# Patient Record
Sex: Female | Born: 1950 | Race: White | Hispanic: No | Marital: Married
Health system: Southern US, Community
[De-identification: ages and names within clinical notes are randomized; demographics above are authoritative.]

---

## 2004-05-07 ENCOUNTER — Ambulatory Visit: Payer: Self-pay | Admitting: Family Medicine

## 2005-02-25 ENCOUNTER — Ambulatory Visit: Payer: Self-pay | Admitting: General Practice

## 2005-03-07 ENCOUNTER — Ambulatory Visit: Payer: Self-pay | Admitting: Obstetrics and Gynecology

## 2006-05-07 ENCOUNTER — Emergency Department: Payer: Self-pay | Admitting: Unknown Physician Specialty

## 2007-09-06 ENCOUNTER — Ambulatory Visit: Payer: Self-pay

## 2010-07-30 ENCOUNTER — Ambulatory Visit: Payer: Self-pay | Admitting: Specialist

## 2010-08-08 ENCOUNTER — Ambulatory Visit: Payer: Self-pay | Admitting: Specialist

## 2012-08-24 ENCOUNTER — Ambulatory Visit: Payer: Self-pay | Admitting: Family Medicine

## 2012-08-26 ENCOUNTER — Ambulatory Visit: Payer: Self-pay | Admitting: Cardiothoracic Surgery

## 2012-08-26 LAB — APTT: Activated PTT: 36.1 secs — ABNORMAL HIGH (ref 23.6–35.9)

## 2012-08-26 LAB — CBC CANCER CENTER
Basophil #: 0.1 x10 3/mm (ref 0.0–0.1)
Basophil %: 0.7 %
Eosinophil #: 0.1 x10 3/mm (ref 0.0–0.7)
Eosinophil %: 0.7 %
HCT: 37.4 % (ref 35.0–47.0)
Lymphocyte #: 2.8 x10 3/mm (ref 1.0–3.6)
MCH: 27.7 pg (ref 26.0–34.0)
MCV: 85 fL (ref 80–100)
Monocyte %: 6.4 %
Neutrophil #: 10.4 x10 3/mm — ABNORMAL HIGH (ref 1.4–6.5)
Platelet: 632 x10 3/mm — ABNORMAL HIGH (ref 150–440)
RBC: 4.42 10*6/uL (ref 3.80–5.20)
RDW: 13.8 % (ref 11.5–14.5)

## 2012-08-26 LAB — COMPREHENSIVE METABOLIC PANEL
Albumin: 3.3 g/dL — ABNORMAL LOW (ref 3.4–5.0)
Alkaline Phosphatase: 98 U/L (ref 50–136)
BUN: 11 mg/dL (ref 7–18)
Bilirubin,Total: 0.2 mg/dL (ref 0.2–1.0)
Chloride: 103 mmol/L (ref 98–107)
Creatinine: 0.97 mg/dL (ref 0.60–1.30)
EGFR (African American): >60
EGFR (Non-African Amer.): >60
Glucose: 113 mg/dL — ABNORMAL HIGH (ref 65–99)
Potassium: 3.6 mmol/L (ref 3.5–5.1)
SGOT(AST): 12 U/L — ABNORMAL LOW (ref 15–37)
SGPT (ALT): 17 U/L (ref 12–78)
Sodium: 140 mmol/L (ref 136–145)

## 2012-08-26 LAB — PROTIME-INR
INR: 1.2
Prothrombin Time: 15.4 secs — ABNORMAL HIGH (ref 11.5–14.7)

## 2012-09-05 ENCOUNTER — Ambulatory Visit: Payer: Self-pay | Admitting: Cardiothoracic Surgery

## 2012-09-06 ENCOUNTER — Ambulatory Visit: Payer: Self-pay | Admitting: Cardiothoracic Surgery

## 2012-09-07 ENCOUNTER — Ambulatory Visit: Payer: Self-pay | Admitting: Cardiothoracic Surgery

## 2012-09-09 LAB — COMPREHENSIVE METABOLIC PANEL
Albumin: 3 g/dL — ABNORMAL LOW (ref 3.4–5.0)
Alkaline Phosphatase: 93 U/L (ref 50–136)
Anion Gap: 11 (ref 7–16)
Bilirubin,Total: 0.3 mg/dL (ref 0.2–1.0)
Calcium, Total: 9.4 mg/dL (ref 8.5–10.1)
Chloride: 103 mmol/L (ref 98–107)
Co2: 26 mmol/L (ref 21–32)
EGFR (African American): >60
EGFR (Non-African Amer.): >60
Glucose: 99 mg/dL (ref 65–99)
Osmolality: 279 (ref 275–301)
Potassium: 3.7 mmol/L (ref 3.5–5.1)
SGPT (ALT): 17 U/L (ref 12–78)

## 2012-09-09 LAB — CBC CANCER CENTER
Basophil #: 0.1 x10 3/mm (ref 0.0–0.1)
Basophil %: 0.9 %
HCT: 33.6 % — ABNORMAL LOW (ref 35.0–47.0)
HGB: 10.9 g/dL — ABNORMAL LOW (ref 12.0–16.0)
Lymphocyte #: 2.8 x10 3/mm (ref 1.0–3.6)
Lymphocyte %: 18.8 %
MCHC: 32.5 g/dL (ref 32.0–36.0)
MCV: 84 fL (ref 80–100)
Monocyte #: 1 x10 3/mm — ABNORMAL HIGH (ref 0.2–0.9)
Neutrophil #: 10.7 x10 3/mm — ABNORMAL HIGH (ref 1.4–6.5)
Neutrophil %: 72.9 %
RDW: 14.2 % (ref 11.5–14.5)
WBC: 14.7 x10 3/mm — ABNORMAL HIGH (ref 3.6–11.0)

## 2012-09-10 LAB — CEA: CEA: 628.5 ng/mL — ABNORMAL HIGH (ref 0.0–4.7)

## 2012-09-16 ENCOUNTER — Other Ambulatory Visit: Payer: Self-pay | Admitting: Oncology

## 2012-09-16 LAB — PROTIME-INR
INR: 1.2
Prothrombin Time: 15.2 secs — ABNORMAL HIGH (ref 11.5–14.7)

## 2012-09-22 ENCOUNTER — Ambulatory Visit: Payer: Self-pay | Admitting: Cardiothoracic Surgery

## 2012-09-24 LAB — COMPREHENSIVE METABOLIC PANEL
Albumin: 2.6 g/dL — ABNORMAL LOW (ref 3.4–5.0)
BUN: 12 mg/dL (ref 7–18)
Calcium, Total: 9.6 mg/dL (ref 8.5–10.1)
Chloride: 99 mmol/L (ref 98–107)
Creatinine: 0.9 mg/dL (ref 0.60–1.30)
Osmolality: 275 (ref 275–301)
Potassium: 3.4 mmol/L — ABNORMAL LOW (ref 3.5–5.1)
SGPT (ALT): 24 U/L (ref 12–78)
Sodium: 134 mmol/L — ABNORMAL LOW (ref 136–145)
Total Protein: 7.5 g/dL (ref 6.4–8.2)

## 2012-09-24 LAB — CBC CANCER CENTER
Eosinophil #: 0 x10 3/mm (ref 0.0–0.7)
Eosinophil %: 0 %
HGB: 10.2 g/dL — ABNORMAL LOW (ref 12.0–16.0)
Lymphocyte %: 6.3 %
Monocyte #: 0.1 x10 3/mm — ABNORMAL LOW (ref 0.2–0.9)
Neutrophil #: 16.8 x10 3/mm — ABNORMAL HIGH (ref 1.4–6.5)
Neutrophil %: 92.3 %
RBC: 3.8 10*6/uL (ref 3.80–5.20)
WBC: 18.2 x10 3/mm — ABNORMAL HIGH (ref 3.6–11.0)

## 2012-10-01 LAB — CBC CANCER CENTER
Basophil %: 0.1 %
Eosinophil %: 0 %
HCT: 29.5 % — ABNORMAL LOW (ref 35.0–47.0)
HGB: 9.8 g/dL — ABNORMAL LOW (ref 12.0–16.0)
MCH: 27 pg (ref 26.0–34.0)
MCHC: 33.1 g/dL (ref 32.0–36.0)
Monocyte #: 0.2 x10 3/mm (ref 0.2–0.9)
Monocyte %: 1.3 %
Platelet: 606 x10 3/mm — ABNORMAL HIGH (ref 150–440)
RBC: 3.61 10*6/uL — ABNORMAL LOW (ref 3.80–5.20)
RDW: 14.4 % (ref 11.5–14.5)
WBC: 16.8 x10 3/mm — ABNORMAL HIGH (ref 3.6–11.0)

## 2012-10-05 ENCOUNTER — Ambulatory Visit: Payer: Self-pay | Admitting: Cardiothoracic Surgery

## 2012-10-14 LAB — CBC CANCER CENTER
Basophil %: 1.4 %
Eosinophil #: 0 x10 3/mm (ref 0.0–0.7)
HCT: 28.6 % — ABNORMAL LOW (ref 35.0–47.0)
HGB: 9.5 g/dL — ABNORMAL LOW (ref 12.0–16.0)
Lymphocyte #: 2 x10 3/mm (ref 1.0–3.6)
Lymphocyte %: 10.5 %
MCH: 27.2 pg (ref 26.0–34.0)
MCHC: 33.1 g/dL (ref 32.0–36.0)
MCV: 82 fL (ref 80–100)
Monocyte %: 2.3 %
Neutrophil #: 16.6 x10 3/mm — ABNORMAL HIGH (ref 1.4–6.5)
Neutrophil %: 85.8 %
RDW: 16.5 % — ABNORMAL HIGH (ref 11.5–14.5)
WBC: 19.4 x10 3/mm — ABNORMAL HIGH (ref 3.6–11.0)

## 2012-10-14 LAB — COMPREHENSIVE METABOLIC PANEL
Albumin: 2.7 g/dL — ABNORMAL LOW (ref 3.4–5.0)
Alkaline Phosphatase: 179 U/L — ABNORMAL HIGH (ref 50–136)
Anion Gap: 6 — ABNORMAL LOW (ref 7–16)
BUN: 12 mg/dL (ref 7–18)
Bilirubin,Total: 0.3 mg/dL (ref 0.2–1.0)
Chloride: 103 mmol/L (ref 98–107)
Creatinine: 0.85 mg/dL (ref 0.60–1.30)
EGFR (African American): >60
EGFR (Non-African Amer.): >60
Glucose: 133 mg/dL — ABNORMAL HIGH (ref 65–99)
Osmolality: 270 (ref 275–301)
Potassium: 4 mmol/L (ref 3.5–5.1)
SGOT(AST): 18 U/L (ref 15–37)
SGPT (ALT): 17 U/L (ref 12–78)
Total Protein: 7.4 g/dL (ref 6.4–8.2)

## 2012-10-21 LAB — CBC CANCER CENTER
Eosinophil #: 0 x10 3/mm (ref 0.0–0.7)
Eosinophil %: 0 %
HCT: 28.2 % — ABNORMAL LOW (ref 35.0–47.0)
HGB: 9.3 g/dL — ABNORMAL LOW (ref 12.0–16.0)
Lymphocyte #: 2.9 x10 3/mm (ref 1.0–3.6)
Lymphocyte %: 13.6 %
MCH: 27.4 pg (ref 26.0–34.0)
MCV: 83 fL (ref 80–100)
Monocyte #: 1.1 x10 3/mm — ABNORMAL HIGH (ref 0.2–0.9)
Monocyte %: 5.4 %
Neutrophil #: 17.1 x10 3/mm — ABNORMAL HIGH (ref 1.4–6.5)
Platelet: 598 x10 3/mm — ABNORMAL HIGH (ref 150–440)
RBC: 3.4 10*6/uL — ABNORMAL LOW (ref 3.80–5.20)
RDW: 16.4 % — ABNORMAL HIGH (ref 11.5–14.5)

## 2012-10-23 LAB — CEA: CEA: 1201 ng/mL — ABNORMAL HIGH (ref 0.0–4.7)

## 2012-10-28 LAB — CBC CANCER CENTER
Basophil #: 0 x10 3/mm (ref 0.0–0.1)
Basophil %: 0.2 %
Eosinophil %: 0 %
HCT: 28.3 % — ABNORMAL LOW (ref 35.0–47.0)
HGB: 9.3 g/dL — ABNORMAL LOW (ref 12.0–16.0)
MCH: 28 pg (ref 26.0–34.0)
MCV: 85 fL (ref 80–100)
Monocyte %: 1.4 %
Neutrophil #: 12.5 x10 3/mm — ABNORMAL HIGH (ref 1.4–6.5)
Neutrophil %: 91.6 %
Platelet: 616 x10 3/mm — ABNORMAL HIGH (ref 150–440)
RBC: 3.32 10*6/uL — ABNORMAL LOW (ref 3.80–5.20)

## 2012-10-28 LAB — BASIC METABOLIC PANEL
Anion Gap: 11 (ref 7–16)
Calcium, Total: 9.2 mg/dL (ref 8.5–10.1)
Co2: 25 mmol/L (ref 21–32)
Creatinine: 1.03 mg/dL (ref 0.60–1.30)
Potassium: 3.7 mmol/L (ref 3.5–5.1)

## 2012-11-04 LAB — COMPREHENSIVE METABOLIC PANEL
Alkaline Phosphatase: 143 U/L — ABNORMAL HIGH (ref 50–136)
Anion Gap: 12 (ref 7–16)
BUN: 13 mg/dL (ref 7–18)
Calcium, Total: 9.5 mg/dL (ref 8.5–10.1)
Chloride: 97 mmol/L — ABNORMAL LOW (ref 98–107)
Co2: 25 mmol/L (ref 21–32)
Creatinine: 0.89 mg/dL (ref 0.60–1.30)
EGFR (African American): >60
Osmolality: 272 (ref 275–301)
SGOT(AST): 21 U/L (ref 15–37)
Sodium: 134 mmol/L — ABNORMAL LOW (ref 136–145)
Total Protein: 7.2 g/dL (ref 6.4–8.2)

## 2012-11-04 LAB — CBC CANCER CENTER
Basophil #: 0 x10 3/mm (ref 0.0–0.1)
Eosinophil #: 0 x10 3/mm (ref 0.0–0.7)
Eosinophil %: 0 %
HCT: 29.3 % — ABNORMAL LOW (ref 35.0–47.0)
HGB: 9.6 g/dL — ABNORMAL LOW (ref 12.0–16.0)
MCH: 27.8 pg (ref 26.0–34.0)
MCHC: 32.8 g/dL (ref 32.0–36.0)
Monocyte #: 0.2 x10 3/mm (ref 0.2–0.9)
Monocyte %: 1.2 %
Neutrophil #: 18.3 x10 3/mm — ABNORMAL HIGH (ref 1.4–6.5)
Platelet: 792 x10 3/mm — ABNORMAL HIGH (ref 150–440)
RDW: 18.9 % — ABNORMAL HIGH (ref 11.5–14.5)
WBC: 19.8 x10 3/mm — ABNORMAL HIGH (ref 3.6–11.0)

## 2012-11-05 ENCOUNTER — Ambulatory Visit: Payer: Self-pay | Admitting: Cardiothoracic Surgery

## 2012-11-18 LAB — COMPREHENSIVE METABOLIC PANEL
Albumin: 2.5 g/dL — ABNORMAL LOW (ref 3.4–5.0)
Alkaline Phosphatase: 147 U/L — ABNORMAL HIGH (ref 50–136)
BUN: 8 mg/dL (ref 7–18)
Bilirubin,Total: 0.3 mg/dL (ref 0.2–1.0)
Calcium, Total: 9.4 mg/dL (ref 8.5–10.1)
Chloride: 96 mmol/L — ABNORMAL LOW (ref 98–107)
Co2: 24 mmol/L (ref 21–32)
SGOT(AST): 20 U/L (ref 15–37)
SGPT (ALT): 10 U/L — ABNORMAL LOW (ref 12–78)

## 2012-11-18 LAB — CBC CANCER CENTER
Basophil #: 0.1 x10 3/mm (ref 0.0–0.1)
Eosinophil #: 0 x10 3/mm (ref 0.0–0.7)
HGB: 9.4 g/dL — ABNORMAL LOW (ref 12.0–16.0)
Lymphocyte #: 1.6 x10 3/mm (ref 1.0–3.6)
Lymphocyte %: 6.1 %
MCHC: 32.9 g/dL (ref 32.0–36.0)
MCV: 85 fL (ref 80–100)
Monocyte #: 0.6 x10 3/mm (ref 0.2–0.9)
Monocyte %: 2.4 %
Neutrophil #: 23.9 x10 3/mm — ABNORMAL HIGH (ref 1.4–6.5)
Platelet: 815 x10 3/mm — ABNORMAL HIGH (ref 150–440)
RDW: 20.3 % — ABNORMAL HIGH (ref 11.5–14.5)
WBC: 26.3 x10 3/mm — ABNORMAL HIGH (ref 3.6–11.0)

## 2012-11-25 LAB — CBC CANCER CENTER
Basophil #: 0.1 x10 3/mm (ref 0.0–0.1)
Eosinophil #: 0 x10 3/mm (ref 0.0–0.7)
Eosinophil %: 0 %
HCT: 25.2 % — ABNORMAL LOW (ref 35.0–47.0)
HGB: 8.3 g/dL — ABNORMAL LOW (ref 12.0–16.0)
Lymphocyte %: 3.2 %
MCH: 28.1 pg (ref 26.0–34.0)
MCHC: 32.8 g/dL (ref 32.0–36.0)
MCV: 86 fL (ref 80–100)
Monocyte #: 1.1 x10 3/mm — ABNORMAL HIGH (ref 0.2–0.9)
Neutrophil #: 25.7 x10 3/mm — ABNORMAL HIGH (ref 1.4–6.5)
Platelet: 644 x10 3/mm — ABNORMAL HIGH (ref 150–440)

## 2012-11-25 LAB — COMPREHENSIVE METABOLIC PANEL
Albumin: 2 g/dL — ABNORMAL LOW (ref 3.4–5.0)
Alkaline Phosphatase: 281 U/L — ABNORMAL HIGH (ref 50–136)
Bilirubin,Total: 0.5 mg/dL (ref 0.2–1.0)
Chloride: 92 mmol/L — ABNORMAL LOW (ref 98–107)
Co2: 30 mmol/L (ref 21–32)
Creatinine: 0.58 mg/dL — ABNORMAL LOW (ref 0.60–1.30)
Glucose: 118 mg/dL — ABNORMAL HIGH (ref 65–99)
Osmolality: 255 (ref 275–301)
Potassium: 3.9 mmol/L (ref 3.5–5.1)
SGOT(AST): 37 U/L (ref 15–37)
Sodium: 127 mmol/L — ABNORMAL LOW (ref 136–145)
Total Protein: 6.3 g/dL — ABNORMAL LOW (ref 6.4–8.2)

## 2012-12-02 LAB — COMPREHENSIVE METABOLIC PANEL
Alkaline Phosphatase: 275 U/L — ABNORMAL HIGH (ref 50–136)
BUN: 13 mg/dL (ref 7–18)
Bilirubin,Total: 0.3 mg/dL (ref 0.2–1.0)
Calcium, Total: 9.6 mg/dL (ref 8.5–10.1)
Chloride: 96 mmol/L — ABNORMAL LOW (ref 98–107)
EGFR (African American): >60
Glucose: 160 mg/dL — ABNORMAL HIGH (ref 65–99)
Osmolality: 270 (ref 275–301)
Potassium: 3.9 mmol/L (ref 3.5–5.1)
SGOT(AST): 33 U/L (ref 15–37)
Total Protein: 6.6 g/dL (ref 6.4–8.2)

## 2012-12-02 LAB — CBC CANCER CENTER
Basophil #: 0 x10 3/mm (ref 0.0–0.1)
Basophil %: 0.1 %
Eosinophil #: 0 x10 3/mm (ref 0.0–0.7)
HCT: 26.6 % — ABNORMAL LOW (ref 35.0–47.0)
Lymphocyte %: 1.3 %
Monocyte #: 0.7 x10 3/mm (ref 0.2–0.9)
Monocyte %: 1.8 %
Neutrophil %: 96.8 %
Platelet: 845 x10 3/mm — ABNORMAL HIGH (ref 150–440)
RBC: 3.06 10*6/uL — ABNORMAL LOW (ref 3.80–5.20)
RDW: 20.1 % — ABNORMAL HIGH (ref 11.5–14.5)
WBC: 39 x10 3/mm — ABNORMAL HIGH (ref 3.6–11.0)

## 2012-12-06 ENCOUNTER — Ambulatory Visit: Payer: Self-pay | Admitting: Cardiothoracic Surgery

## 2012-12-08 LAB — CBC CANCER CENTER
Eosinophil %: 0 %
Lymphocyte #: 0.5 x10 3/mm — ABNORMAL LOW (ref 1.0–3.6)
MCHC: 31.2 g/dL — ABNORMAL LOW (ref 32.0–36.0)
MCV: 88 fL (ref 80–100)
Monocyte %: 4 %
Neutrophil #: 31.7 x10 3/mm — ABNORMAL HIGH (ref 1.4–6.5)
RDW: 19.6 % — ABNORMAL HIGH (ref 11.5–14.5)
WBC: 33.6 x10 3/mm — ABNORMAL HIGH (ref 3.6–11.0)

## 2012-12-09 LAB — COMPREHENSIVE METABOLIC PANEL
Albumin: 2.1 g/dL — ABNORMAL LOW (ref 3.4–5.0)
Alkaline Phosphatase: 297 U/L — ABNORMAL HIGH (ref 50–136)
Bilirubin,Total: 0.3 mg/dL (ref 0.2–1.0)
Calcium, Total: 9.4 mg/dL (ref 8.5–10.1)
Co2: 28 mmol/L (ref 21–32)
Creatinine: 0.7 mg/dL (ref 0.60–1.30)
EGFR (African American): >60
EGFR (Non-African Amer.): >60
Glucose: 139 mg/dL — ABNORMAL HIGH (ref 65–99)
Potassium: 4 mmol/L (ref 3.5–5.1)
SGOT(AST): 31 U/L (ref 15–37)
SGPT (ALT): 23 U/L (ref 12–78)
Sodium: 130 mmol/L — ABNORMAL LOW (ref 136–145)
Total Protein: 6.6 g/dL (ref 6.4–8.2)

## 2012-12-09 LAB — CBC CANCER CENTER
Basophil %: 0.8 %
Eosinophil %: 0 %
HCT: 25 % — ABNORMAL LOW (ref 35.0–47.0)
HGB: 7.9 g/dL — ABNORMAL LOW (ref 12.0–16.0)
Lymphocyte #: 0.5 x10 3/mm — ABNORMAL LOW (ref 1.0–3.6)
Lymphocyte %: 1.9 %
MCHC: 31.5 g/dL — ABNORMAL LOW (ref 32.0–36.0)
MCV: 88 fL (ref 80–100)
Monocyte %: 3.5 %
Neutrophil #: 27.4 x10 3/mm — ABNORMAL HIGH (ref 1.4–6.5)
RBC: 2.84 10*6/uL — ABNORMAL LOW (ref 3.80–5.20)

## 2012-12-09 LAB — HCG, QUANTITATIVE, PREGNANCY: Beta Hcg, Quant.: 1 m[IU]/mL

## 2012-12-10 LAB — AFP TUMOR MARKER: AFP-Tumor Marker: 3.6 ng/mL (ref 0.0–8.3)

## 2012-12-15 LAB — CBC CANCER CENTER
Basophil %: 0.2 %
Eosinophil #: 0 x10 3/mm (ref 0.0–0.7)
Eosinophil %: 0 %
HGB: 7.5 g/dL — ABNORMAL LOW (ref 12.0–16.0)
Lymphocyte #: 0.4 x10 3/mm — ABNORMAL LOW (ref 1.0–3.6)
MCH: 29.5 pg (ref 26.0–34.0)
MCHC: 33.2 g/dL (ref 32.0–36.0)
Monocyte #: 1.6 x10 3/mm — ABNORMAL HIGH (ref 0.2–0.9)
Neutrophil #: 26.2 x10 3/mm — ABNORMAL HIGH (ref 1.4–6.5)
Neutrophil %: 92.7 %
Platelet: 434 x10 3/mm (ref 150–440)
RBC: 2.54 10*6/uL — ABNORMAL LOW (ref 3.80–5.20)
RDW: 19.7 % — ABNORMAL HIGH (ref 11.5–14.5)
WBC: 28.3 x10 3/mm — ABNORMAL HIGH (ref 3.6–11.0)

## 2012-12-16 LAB — COMPREHENSIVE METABOLIC PANEL
Albumin: 2.1 g/dL — ABNORMAL LOW (ref 3.4–5.0)
Alkaline Phosphatase: 384 U/L — ABNORMAL HIGH (ref 50–136)
Anion Gap: 11 (ref 7–16)
BUN: 9 mg/dL (ref 7–18)
Bilirubin,Total: 0.4 mg/dL (ref 0.2–1.0)
Calcium, Total: 9.4 mg/dL (ref 8.5–10.1)
Chloride: 90 mmol/L — ABNORMAL LOW (ref 98–107)
Co2: 28 mmol/L (ref 21–32)
Creatinine: 0.57 mg/dL — ABNORMAL LOW (ref 0.60–1.30)
EGFR (African American): >60
EGFR (Non-African Amer.): >60
Glucose: 133 mg/dL — ABNORMAL HIGH (ref 65–99)
Osmolality: 260 (ref 275–301)
Potassium: 3.7 mmol/L (ref 3.5–5.1)
SGOT(AST): 34 U/L (ref 15–37)
SGPT (ALT): 37 U/L (ref 12–78)
Sodium: 129 mmol/L — ABNORMAL LOW (ref 136–145)
Total Protein: 6.3 g/dL — ABNORMAL LOW (ref 6.4–8.2)

## 2012-12-16 LAB — CBC CANCER CENTER
Eosinophil #: 0 x10 3/mm (ref 0.0–0.7)
Eosinophil %: 0 %
HCT: 25.1 % — ABNORMAL LOW (ref 35.0–47.0)
Lymphocyte #: 0.3 x10 3/mm — ABNORMAL LOW (ref 1.0–3.6)
Lymphocyte %: 1 %
MCH: 28.7 pg (ref 26.0–34.0)
MCHC: 32 g/dL (ref 32.0–36.0)
Monocyte #: 1.2 x10 3/mm — ABNORMAL HIGH (ref 0.2–0.9)
Monocyte %: 4.5 %
Neutrophil #: 26.3 x10 3/mm — ABNORMAL HIGH (ref 1.4–6.5)
Neutrophil %: 94.4 %
RDW: 20.5 % — ABNORMAL HIGH (ref 11.5–14.5)

## 2012-12-23 LAB — CBC CANCER CENTER
Basophil #: 0 x10 3/mm (ref 0.0–0.1)
Basophil %: 0.1 %
Eosinophil %: 0.1 %
HCT: 25.8 % — ABNORMAL LOW (ref 35.0–47.0)
HGB: 8.3 g/dL — ABNORMAL LOW (ref 12.0–16.0)
Lymphocyte %: 1.5 %
MCH: 29.6 pg (ref 26.0–34.0)
MCV: 92 fL (ref 80–100)
Monocyte #: 1 x10 3/mm — ABNORMAL HIGH (ref 0.2–0.9)
Monocyte %: 5.1 %
Neutrophil #: 18.1 x10 3/mm — ABNORMAL HIGH (ref 1.4–6.5)
Neutrophil %: 93.2 %
Platelet: 510 x10 3/mm — ABNORMAL HIGH (ref 150–440)
RDW: 20.5 % — ABNORMAL HIGH (ref 11.5–14.5)

## 2012-12-23 LAB — BASIC METABOLIC PANEL
Anion Gap: 8 (ref 7–16)
BUN: 8 mg/dL (ref 7–18)
Calcium, Total: 9.2 mg/dL (ref 8.5–10.1)
Chloride: 93 mmol/L — ABNORMAL LOW (ref 98–107)
EGFR (Non-African Amer.): >60
Osmolality: 262 (ref 275–301)
Sodium: 131 mmol/L — ABNORMAL LOW (ref 136–145)

## 2013-01-05 ENCOUNTER — Ambulatory Visit: Payer: Self-pay | Admitting: Cardiothoracic Surgery

## 2013-01-10 LAB — CBC CANCER CENTER
Basophil #: 0.1 x10 3/mm (ref 0.0–0.1)
Eosinophil #: 0 x10 3/mm (ref 0.0–0.7)
Eosinophil %: 0 %
HCT: 27.1 % — ABNORMAL LOW (ref 35.0–47.0)
Lymphocyte %: 1.2 %
MCH: 30 pg (ref 26.0–34.0)
MCHC: 31.8 g/dL — ABNORMAL LOW (ref 32.0–36.0)
MCV: 94 fL (ref 80–100)
Monocyte #: 0.7 x10 3/mm (ref 0.2–0.9)
Monocyte %: 2.4 %
Platelet: 869 x10 3/mm — ABNORMAL HIGH (ref 150–440)
RBC: 2.87 10*6/uL — ABNORMAL LOW (ref 3.80–5.20)
RDW: 21.3 % — ABNORMAL HIGH (ref 11.5–14.5)
WBC: 31.3 x10 3/mm — ABNORMAL HIGH (ref 3.6–11.0)

## 2013-01-10 LAB — COMPREHENSIVE METABOLIC PANEL
Albumin: 2.4 g/dL — ABNORMAL LOW (ref 3.4–5.0)
Alkaline Phosphatase: 184 U/L — ABNORMAL HIGH (ref 50–136)
BUN: 12 mg/dL (ref 7–18)
Bilirubin,Total: 0.1 mg/dL — ABNORMAL LOW (ref 0.2–1.0)
Chloride: 99 mmol/L (ref 98–107)
Co2: 24 mmol/L (ref 21–32)
Creatinine: 0.64 mg/dL (ref 0.60–1.30)
EGFR (African American): >60
EGFR (Non-African Amer.): >60
Glucose: 198 mg/dL — ABNORMAL HIGH (ref 65–99)
Osmolality: 279 (ref 275–301)
SGOT(AST): 27 U/L (ref 15–37)

## 2013-01-23 ENCOUNTER — Emergency Department: Payer: Self-pay | Admitting: Internal Medicine

## 2013-01-23 LAB — CBC
HCT: 27.3 % — ABNORMAL LOW (ref 35.0–47.0)
HGB: 8.6 g/dL — ABNORMAL LOW (ref 12.0–16.0)
MCH: 28.5 pg (ref 26.0–34.0)
MCV: 91 fL (ref 80–100)
Platelet: 928 10*3/uL — ABNORMAL HIGH (ref 150–440)
RBC: 3.01 10*6/uL — ABNORMAL LOW (ref 3.80–5.20)

## 2013-01-23 LAB — COMPREHENSIVE METABOLIC PANEL
Albumin: 2.5 g/dL — ABNORMAL LOW (ref 3.4–5.0)
Alkaline Phosphatase: 158 U/L — ABNORMAL HIGH (ref 50–136)
Anion Gap: 6 — ABNORMAL LOW (ref 7–16)
BUN: 11 mg/dL (ref 7–18)
Bilirubin,Total: 0.3 mg/dL (ref 0.2–1.0)
Chloride: 100 mmol/L (ref 98–107)
EGFR (African American): >60
EGFR (Non-African Amer.): >60
Glucose: 93 mg/dL (ref 65–99)
Osmolality: 267 (ref 275–301)
Potassium: 4.2 mmol/L (ref 3.5–5.1)
SGOT(AST): 26 U/L (ref 15–37)
SGPT (ALT): 11 U/L — ABNORMAL LOW (ref 12–78)

## 2013-01-24 LAB — COMPREHENSIVE METABOLIC PANEL
Albumin: 2.3 g/dL — ABNORMAL LOW (ref 3.4–5.0)
Alkaline Phosphatase: 150 U/L — ABNORMAL HIGH (ref 50–136)
BUN: 13 mg/dL (ref 7–18)
Bilirubin,Total: 0.3 mg/dL (ref 0.2–1.0)
Calcium, Total: 9.1 mg/dL (ref 8.5–10.1)
Creatinine: 0.51 mg/dL — ABNORMAL LOW (ref 0.60–1.30)
EGFR (African American): >60
EGFR (Non-African Amer.): >60
Glucose: 105 mg/dL — ABNORMAL HIGH (ref 65–99)
Potassium: 4 mmol/L (ref 3.5–5.1)
SGOT(AST): 30 U/L (ref 15–37)
Sodium: 132 mmol/L — ABNORMAL LOW (ref 136–145)
Total Protein: 6.8 g/dL (ref 6.4–8.2)

## 2013-01-24 LAB — CBC CANCER CENTER
Basophil #: 0.2 x10 3/mm — ABNORMAL HIGH (ref 0.0–0.1)
Eosinophil #: 0 x10 3/mm (ref 0.0–0.7)
Eosinophil %: 0 %
MCH: 29.1 pg (ref 26.0–34.0)
MCHC: 31.9 g/dL — ABNORMAL LOW (ref 32.0–36.0)
MCV: 91 fL (ref 80–100)
Monocyte #: 1.1 x10 3/mm — ABNORMAL HIGH (ref 0.2–0.9)
Neutrophil #: 38.1 x10 3/mm — ABNORMAL HIGH (ref 1.4–6.5)
Neutrophil %: 95.2 %
RBC: 3.03 10*6/uL — ABNORMAL LOW (ref 3.80–5.20)
RDW: 18.9 % — ABNORMAL HIGH (ref 11.5–14.5)
WBC: 40 x10 3/mm — ABNORMAL HIGH (ref 3.6–11.0)

## 2013-01-31 LAB — COMPREHENSIVE METABOLIC PANEL
Alkaline Phosphatase: 159 U/L — ABNORMAL HIGH (ref 50–136)
Anion Gap: 10 (ref 7–16)
Calcium, Total: 9.1 mg/dL (ref 8.5–10.1)
Chloride: 96 mmol/L — ABNORMAL LOW (ref 98–107)
Glucose: 124 mg/dL — ABNORMAL HIGH (ref 65–99)
Osmolality: 266 (ref 275–301)
Potassium: 3.8 mmol/L (ref 3.5–5.1)
SGOT(AST): 24 U/L (ref 15–37)
SGPT (ALT): 12 U/L (ref 12–78)
Total Protein: 6.8 g/dL (ref 6.4–8.2)

## 2013-01-31 LAB — CBC CANCER CENTER
Basophil #: 0 x10 3/mm (ref 0.0–0.1)
Basophil %: 0.1 %
Eosinophil #: 0.1 x10 3/mm (ref 0.0–0.7)
Eosinophil %: 0.2 %
HGB: 9 g/dL — ABNORMAL LOW (ref 12.0–16.0)
Lymphocyte #: 0.6 x10 3/mm — ABNORMAL LOW (ref 1.0–3.6)
MCH: 28.5 pg (ref 26.0–34.0)
Monocyte %: 4.1 %
Neutrophil #: 30.7 x10 3/mm — ABNORMAL HIGH (ref 1.4–6.5)
RBC: 3.15 10*6/uL — ABNORMAL LOW (ref 3.80–5.20)
RDW: 18.5 % — ABNORMAL HIGH (ref 11.5–14.5)

## 2013-02-05 ENCOUNTER — Ambulatory Visit: Payer: Self-pay | Admitting: Cardiothoracic Surgery

## 2013-03-07 DEATH — deceased

## 2014-07-28 NOTE — Consult Note (Signed)
Reason for Visit: This 64 year old Female patient presents to the clinic for initial evaluation of  lung cancer .   Referred by Dr. Oliva Bustard.  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 She was found to have a right upper lobe mass. CT scan demonstrated mass invading into the mediastinum. This was confirmed on PET/CT scan to be a hypermetabolic mass in the right upper lobe. She also had an hypermetabolic avid region in the perirectal tissue. Biopsy of the lung was positive for adenocarcinoma. She also had an endoscopic ultrasound at [facility]. Endoscopic ultrasound was negative for any rectal abnormalities although biopsy of her perirectal node again was positive for adenocarcinoma with immunohistochemical stains essentially the same as her lung mass. She's been started on carboplatinum and Taxol in June of 2014 100 Dr. Metro Kung direction. She's had a minimal response to chemotherapy and is now referred to radiation oncology for consideration of palliative treatment to her chest. She is doing fairly well although quite weak. She states she has somewhat of a sore throat no specific dysphasia at this time.Dr. Oliva Bustard has asked to evaluate the patient for possibility of palliative chemotherapy and she is seen today.  Past Hx:    Kidney Stones:    Lung Cancer:    Pneumonia: Jun 2014    Hypothyroidism:    HTN:    CT Guided lung biopsy: 2014   Tonsillectomy:    Hysterectomy:    Colonoscopy: Jun 2014   Thumb Arthroplasty:   Past, Family and Social History:  Past Medical History positive   Cardiovascular hypertension   Respiratory pneumonia   Genitourinary kidney stones   Endocrine hypothyroidism   Past Surgical History hysterectomy, tonsillectomy, thumb arthroplasty   Family History positive   Family History Comments family history of grandfather with lung cancer, mother with pneumonia and father deceased from a CVA.   Social History positive   Social History Comments patient is an probable 20 a pack smoking history does not smoke and probably 30 years. No EtOH abuse history   Additional Past Medical and Surgical History accompanied by husband today   Allergies:   Sulfa drugs: Rash  Home Meds:  Home Medications: Medication Instructions Status  Norco 5 mg-325 mg oral tablet 1 tab(s) orally every 4 to 6 hours, As Needed Active  predniSONE 10 mg oral tablet 1 tab(s) orally once a day Active  Levaquin 500 mg oral tablet 1 tab(s) orally every 24 hours Active  Zofran 4 mg oral tablet 1 tab(s) orally every 6 hours Active  dexamethasone 4 mg oral tablet 2 tab(s) orally once a day take night prior to chemo  Active  lisinopril 20 mg oral tablet 1 tab(s) orally once a day (in the morning) Active  ciprofloxacin 500 mg oral tablet 1 tab(s) orally every 12 hours-x 5 days Active  NIFEdipine 30 mg oral tablet, extended release 1  orally  AM Active  naproxen  500 mg oral delayed release tablet 1  orally  2 x daily as needed Active  levothyroxine 75 mcg (0.075 mg) oral tablet 1  orally  AM Active   Review of Systems:  General negative   Performance Status (ECOG) 0   Skin negative   Breast negative   Ophthalmologic negative   ENMT negative   Respiratory and Thorax see HPI   Cardiovascular negative   Gastrointestinal negative   Genitourinary  negative   Musculoskeletal negative   Neurological negative   Psychiatric negative   Hematology/Lymphatics negative   Endocrine negative   Allergic/Immunologic negative   Nursing Notes:  Nursing Vital Signs and Chemo Nursing Nursing Notes: *CC Vital Signs Flowsheet:   31-Jul-14 09:10  Temp Temperature 98.5  Pulse Pulse 123  Respirations Respirations 20  SBP SBP 112  DBP DBP 65  Pain Scale (0-10)  0  Current Weight (kg) (kg) 54.6  Height (cm) centimeters 154.9  BSA (m2) 1.5   Physical Exam:  General/Skin/HEENT:  General normal   Skin normal   Eyes normal   ENMT normal   Head and Neck normal   Additional PE well-developed thin female somewhat fragile in NAD. Neck is clear without evidence of cervical or supraclavicular adenopathy. Lungs are clear to A&P cardiac examination shows regular rate and rhythm. Abdomen is benign with no organomegaly or masses noted. Positive bowel sounds all 4 quadrants. No evidence of venous jugular distention or chest wall collateral circulation is evident.   Breasts/Resp/CV/GI/GU:  Respiratory and Thorax normal   Cardiovascular normal   Gastrointestinal normal   Genitourinary normal   MS/Neuro/Psych/Lymph:  Musculoskeletal normal   Neurological normal   Lymphatics normal   Other Results:  Radiology Results: LabUnknown:    02-Jun-14 14:19, PET/CT Scan Lung Cancer Diagnosis  PACS Image     04-Jun-14 10:01, MRI Brain  With/Without Contrast  PACS Image     29-Jul-14 10:09, CT Chest Without Contrast  PACS Image   MRI:    04-Jun-14 10:01, MRI Brain  With/Without Contrast  MRI Brain  With/Without Contrast   REASON FOR EXAM:    Right lung mass  COMMENTS:       PROCEDURE: MMR - MMR BRAIN WO/W CONTRAST  - Sep 08 2012 10:01AM     RESULT: Pre- and postgadolinium MR imaging of the brain is performed. The   patient has no previous exam for comparison.    Axialdiffusion sequence images show a tiny area of increased signal in    the right parietal white matter on image 17 concerning for restricted   diffusion. This is a very small area measuring only 5 mm. This area shows   increased FLAIR sequence and T2 sequence signal with no decreased T1   signal on the sagittal, coronal or axial images and no evidence of   abnormal enhancement following gadolinium administration. The ADC map   images do not show significant signal abnormality in this region.  Otherwise, the ventricles and sulci appear to be normal. There is no   evidence of intracranial hemorrhage or blood breakdown products signal   abnormality. No other areas of abnormal diffusion signal are present. T2   and FLAIR sequence images show no significant abnormal signal within the   periventricular white matter otherwise. There is minimal mucosal   thickening in the ethmoid air cells with normal aeration of the sinuses   and mastoids otherwise. No abnormal enhancement is seen on the post   gadolinium coronal and sagittal T1  images.    IMPRESSION:  Tiny area of increased diffusion signal, FLAIR signal and T2   signal in the right parietal region as described. No evidence of abnormal   T1 signal or enhancement. Correlate for tiny evolving infarct. There is   no surrounding edema to suggest underlying mass. There is again, no   evidence of abnormal enhancement. The study is otherwise unremarkable.  Dictation Site: 1        Verified By: Sundra Aland, M.D., MD  CT:    29-Jul-14 10:09, CT Chest Without Contrast  CT Chest Without Contrast   REASON FOR EXAM:    Lung mass restage  COMMENTS:       PROCEDURE: KCT - KCT CHEST WITHOUT CONTRAST  - Nov 02 2012 10:09AM     RESULT:     Comparison is made to a prior study dated 08/24/2012.    Technique: Helical noncontrast 5 mm sections were obtained from the   thoracic inlet through the lung bases.    Findings: A right upper lobe soft tissue mass is appreciated with   extension into the right hilar region.  When compared to the previous   study, the mass appears to have increased in size and conspicuity     measuring 8.83 x 5.64 cm in maximal orthogonal dimensions on image #25 of   the soft tissue series. There is compression and/or possible invasion of   the right upper lobe bronchus as well as the right middle lobe bronchus   and a component of the bronchus intermedius. Within the right lung base,   there is atelectatic/consolidative lung within the medial segment of the   right middle lobe. This finding has developed in the interim when   compared to the previous study. Volume loss is identified within the   right hemithorax and also developed in the interim. No further focal   regions of consolidation or focal infiltrates or masses are identified.    The visualized upper abdominal viscera demonstrate no gross abnormalities.    IMPRESSION:  Findings consistent with increased disease burden involving   the right upper lobe mass. There are findings consistent with mediastinal   invasion as well as areas of compression and/or invasion of the right     upper lobe bronchus, right middle lobe and bronchus intermedius.      Thank you for this opportunity to contribute to the care of your patient.         Verified By: Mikki Santee, M.D., MD  Nuclear Med:    02-Jun-14 14:19, PET/CT Scan Lung Cancer Diagnosis  PET/CT Scan Lung Cancer Diagnosis   REASON FOR EXAM:    Right lung mass  COMMENTS:       PROCEDURE: PET - PET/CT DX LUNG CA  - Sep 06 2012  2:19PM     RESULT: The patient is undergoing staging of suspected right lung   malignancy. The patient's fasting blood glucose level was 97 mg/dL.The   patient received 11.69 mCi of F-18 labeled FDG at 1250 p.m. with scanning   beginning at 1356 hours. A noncontrast CT scan was performed at the same   sitting for coregistration and attenuation correction.    Within the neck no significant abnormal uptake is demonstrated. Within   the  thorax there is a large amount of increased uptake associated with   the upper lobe mass which extends into the right hilum and into the right   aspect of the mediastinum. The  maximal SUV is 28.8 with a mean of 16.2 I     do not see abnormal uptake in the left aspect of the thorax. No abnormal   skeletal uptake is demonstrated within the thorax.    Within the abdomen there is normal appearance of the uptake pattern   within the liver and spleen and adrenalregions. Normal expected uptake   is demonstrated within the kidneys and ureters and urinary bladder. There   is intensely increased uptake associated with a soft tissue density mass   adjacent to loop of the rectosigmoid on image 307. This nodule measures   approximately 2 cm in diameter and right reflects an involved lymph node.   This exhibits maximal SUV of 25.6 with a mean of 16.6.    On the CT images no bulky cervical lymph nodes are demonstrated. Within   the thorax the large known mass inthe right upper lobe is demonstrated.   Insinuates itself into the right aspect of the mediastinum. There are   enlarged pre- tracheal and precarinal lymph nodes as well as large hilar     lymph nodes on the right. There is no pleural or pericardial effusion.   Within the abdomen the liver, gallbladder, adrenal glands, spleen appear   normal. The kidneys exhibit no evidence of obstruction. There are   punctate nonobstructing stones in the left kidney. The unopacified loops   of small and large bowel are normal in appearance.    IMPRESSION:   1. There is an intensely hypermetabolic mass in the right upper lobe   extending into the right hilum and mediastinum highly suspicious for   malignancy.  2. There is a hypermetabolic 2 cm diameter mass in the left perirectal   region demonstrated on images 300 through 313 that is worrisome for a   metastatic lymph node.     Dictation Site: 1    Verified By: DAVID A. Martinique, M.D., MD   Relevent  Results:   Relevant Scans and Labs PET/CT scan MRI scan of the brain and recent CT scan all reviewed compatible to above stated findings.   Assessment and Plan: Impression:   64 year old female with stage IV adenocarcinoma of the right upper lobe with perirectal metastatic site is status post initial chemotherapy with minimal response. Plan:  this time I have recommended going ahead with palliative radiation therapy to her chest. Would plan on delivering 4000 cGy over 4 weeks using 3-dimensional treatment planning. Risks and benefits of treatment were described to the patient and her husband. Certainly this lesion is in an area where eventual superior vena cava syndrome may develop. Also would like to prevent further atelectasis of the lung or hemoptysis. I would treat a split course fashion if after 2 week break she is tolerated her treatments well and we see a significant response may also boost her lung lesion. Side effects such as possible dysphasia from radiation esophagitis, alteration blood counts, fatigue, skin reaction, all were explained in detail to the patient. I have set her up for CT simulation early next week and we'll coordinate systemic chemotherapy in conjunction with radiation with Dr. Oliva Bustard staff. Case was personally discussed with Dr. Oliva Bustard who is in agreement with her treatment plan.  I would like to take this opportunity to thank you for allowing me to continue to participate in this patient's care.  CC Referral:  cc: Dr. Calla Kicks   Electronic Signatures: Baruch Gouty Roda Shutters (MD)  (Signed 31-Jul-14 11:31)  Authored: HPI, Diagnosis, Past  Hx, PFSH, Allergies, Home Meds, ROS, Nursing Notes, Physical Exam, Other Results, Relevent Results, Encounter Assessment and Plan, CC Referring Physician   Last Updated: 31-Jul-14 11:31 by Armstead Peaks (MD)

## 2014-07-28 NOTE — Op Note (Signed)
PATIENT NAME:  Janet Barr, Janet Barr MR#:  [id number] DATE OF BIRTH:  1950/08/15  DATE OF PROCEDURE:  09/22/2012  SURGEON:  Marcial Pacasimothy E. Thelma Bargeaks, M.D.   ASSISTANT:  None.   PREOPERATIVE DIAGNOSIS:  Lung cancer.  POSTOPERATIVE DIAGNOSIS:  Lung cancer.   OPERATION PERFORMED:  Ultrasound-guided right internal jugular vein Port-A-Cath insertion.   INDICATIONS FOR PROCEDURE:  [name] is a 64 year old woman with a recently diagnosed unresectable lung cancer, is going to be treated with radiation therapy and chemotherapy.  She needs intravenous access for a prolonged chemotherapy administration.  The indications and risks were explained to the patient who gave her informed consent.   DESCRIPTION OF PROCEDURE:  The patient was brought to the operating suite and placed in the supine position.  General anesthesia was given with laryngeal mask airway.  The patient was prepped and draped in the usual sterile fashion.  Using the ultrasound probe we identified the right internal jugular vein.  The right internal jugular vein was then percutaneously catheterized.  A wire was placed under fluoroscopic guidance into the right side of the heart.  The port site was created on the anterior surface of the chest wall.  A tunneler was brought up onto the field and the catheter was tunneled from the port site up to the access site at the internal jugular vein.  The catheter was then inserted through a peel-away sheath placed over the wire.  The catheter was pulled back into proper position under fluoroscopic guidance.  There was good return of venous return in a syringe and it flushed nicely.  The catheter was then assembled to the Port-A-Cath hub and it was then secured to the anterior chest wall with interrupted 2-0 Prolene sutures on the pectoral fascia.  The catheter was again checked and found to be in proper position.  There was no pneumothorax.  The wounds were then closed with Vicryl on the subcutaneous tissues and nylon on the  skin.  The catheter was accessed with the needle and again it was irrigated and flushed nicely.  This was so the patient could receive her chemotherapy tomorrow.  Sterile dressings were applied.     ____________________________ Sheppard Plumberimothy E. Thelma Bargeaks, MD teo:ea D: 09/22/2012 16:37:36 ET T: 09/22/2012 23:01:46 ET JOB#: [id number]  cc: Marcial Pacasimothy E. Thelma Bargeaks, MD, <Dictator> Jasmine DecemberIMOTHY E Laurieanne Galloway MD ELECTRONICALLY SIGNED 09/28/2012 11:01

## 2015-01-19 IMAGING — CT CT CHEST W/ CM
1 series · 15 of 34 positions shown, 19 images · IV contrast (isovue)
Comparison: none

REASON FOR EXAM: Call Report [id number]  Before  abnormal XR mass rt upper
lobe   fluid on lung
COMMENTS:

PROCEDURE:     CT  - CT CHEST WITH CONTRAST  - August 24, 2012  [DATE]
RESULT:     Comparison: None
TECHNIQUE: Multiple axial images of the chest were obtained with 75 mL
Isovue 370 intravenous contrast.

[Series 2: soft tissue · axial · 0.63mm/px · z∈[-710,-443]mm · 15 of 105 slices shown, 19 images]
[im 8/105  mediastinal]
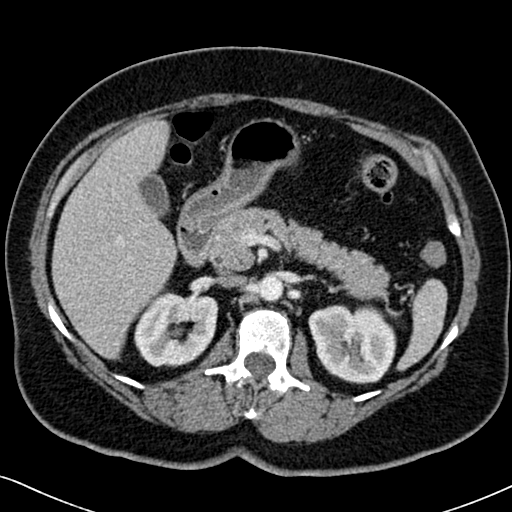
[im 8/105  lung]
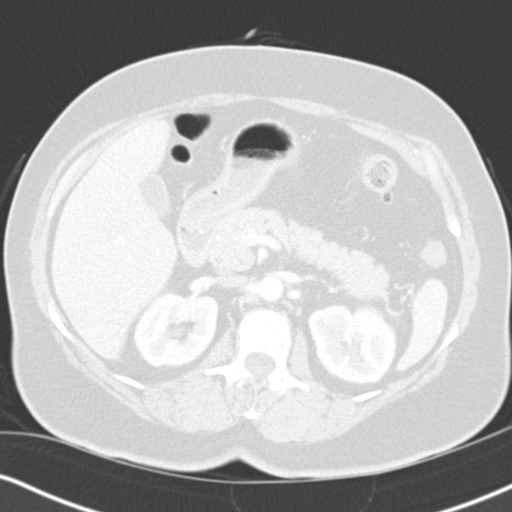
[im 16/105  lung]
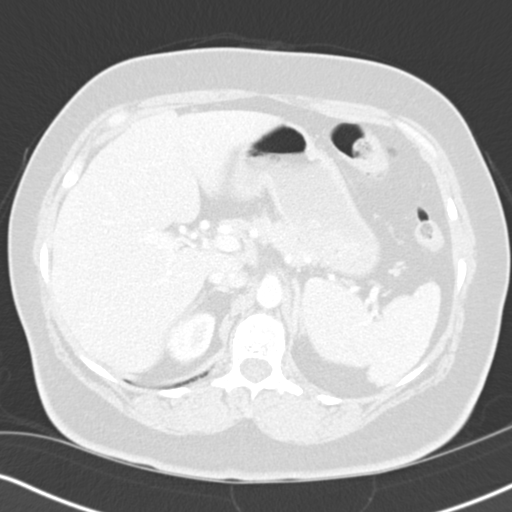
[im 21/105  lung]
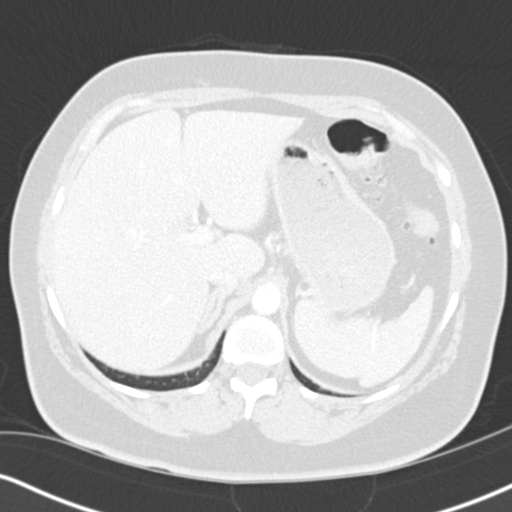
[im 27/105  lung]
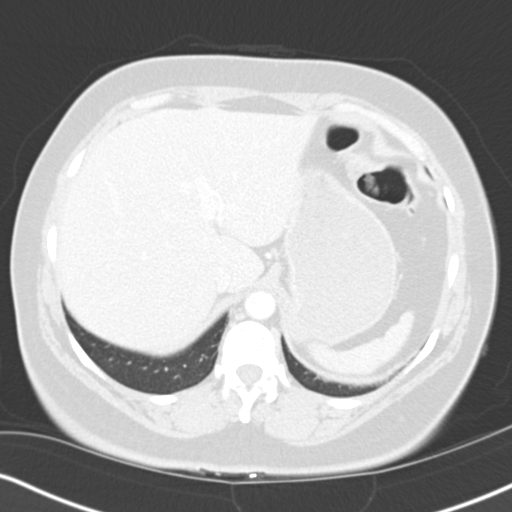
[im 35/105  mediastinal]
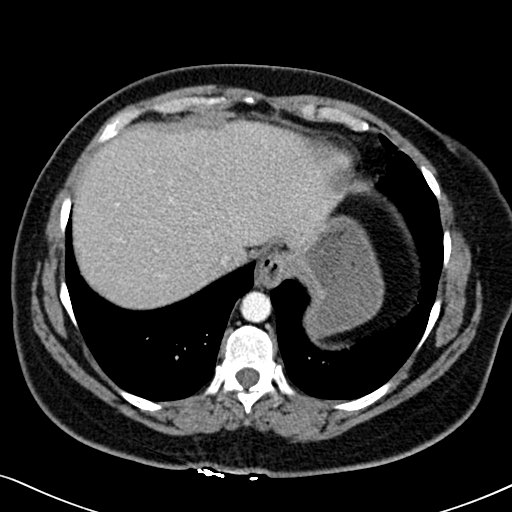
[im 35/105  lung]
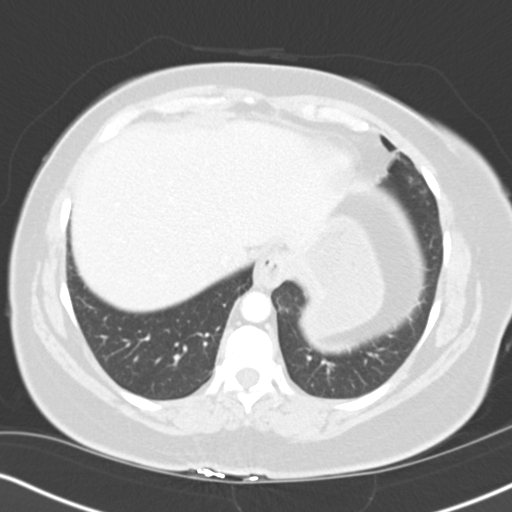
[im 42/105  lung]
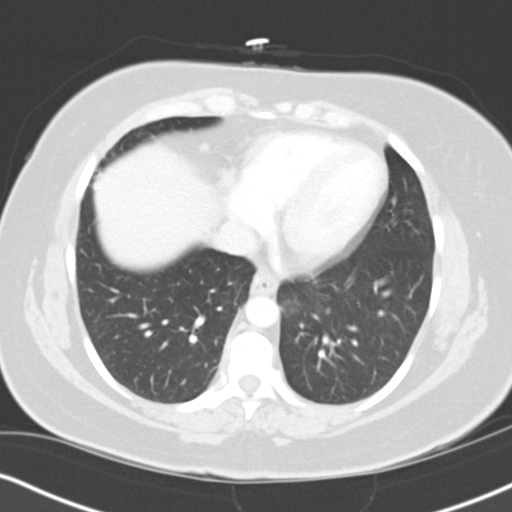
[im 47/105  lung]
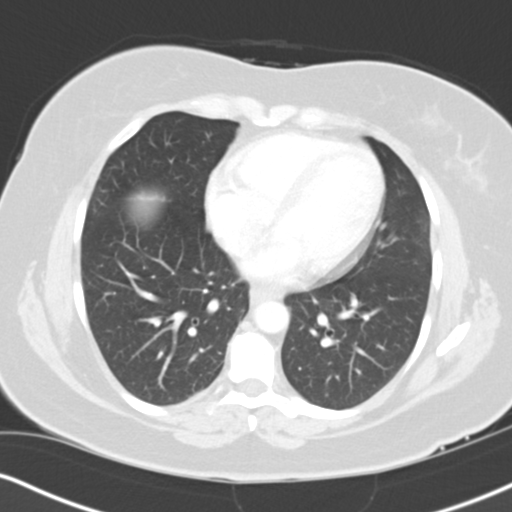
[im 54/105  lung]
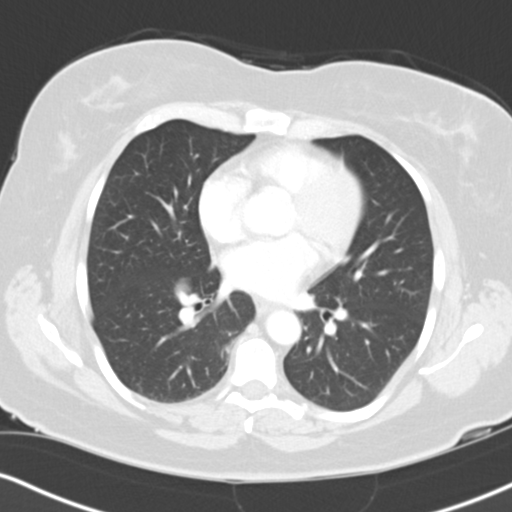
[im 58/105  mediastinal]
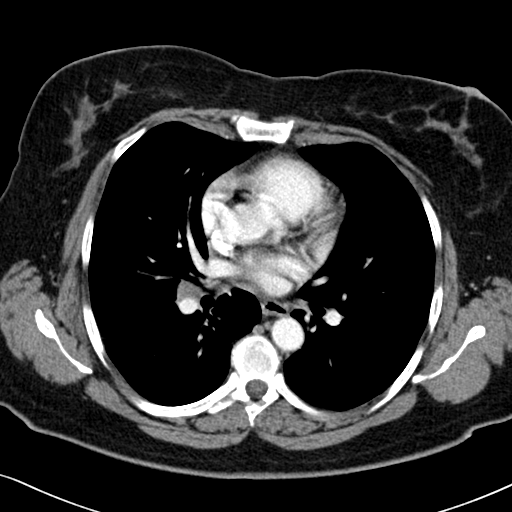
[im 58/105  lung]
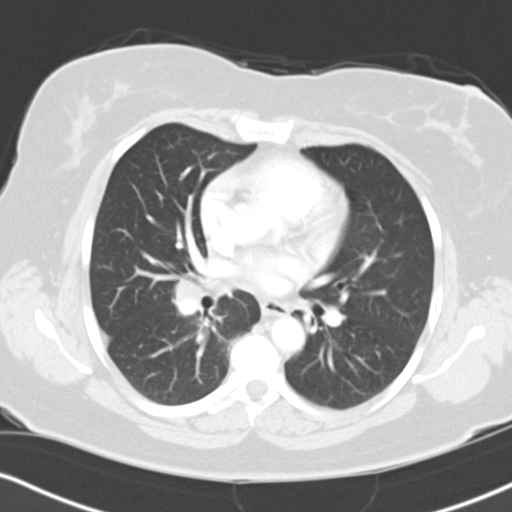
[im 63/105  lung]
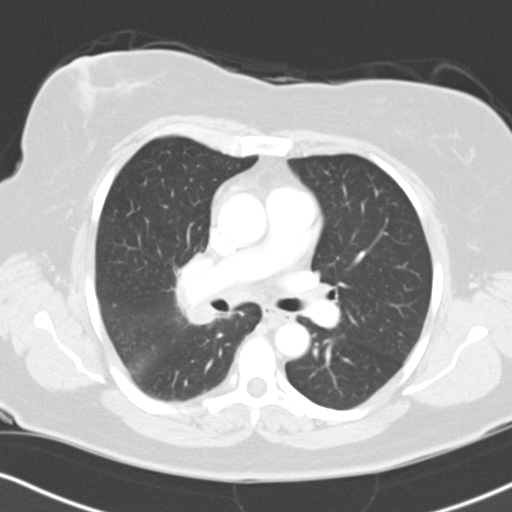
[im 70/105  lung]
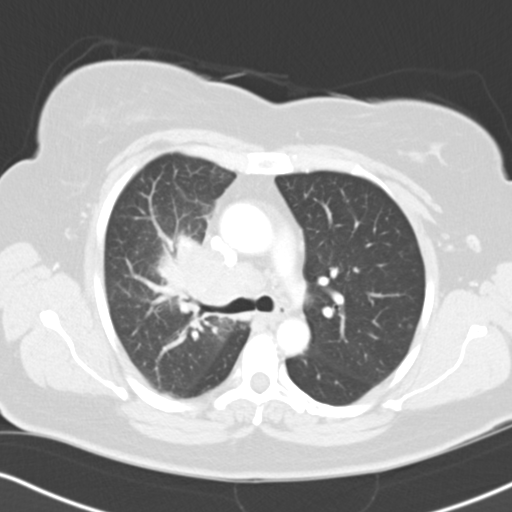
[im 78/105  lung]
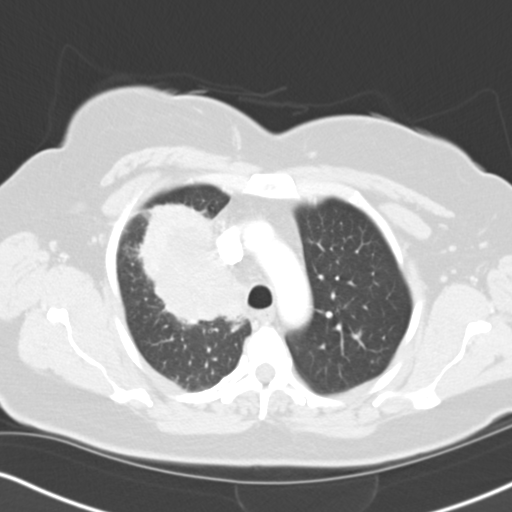
[im 84/105  mediastinal]
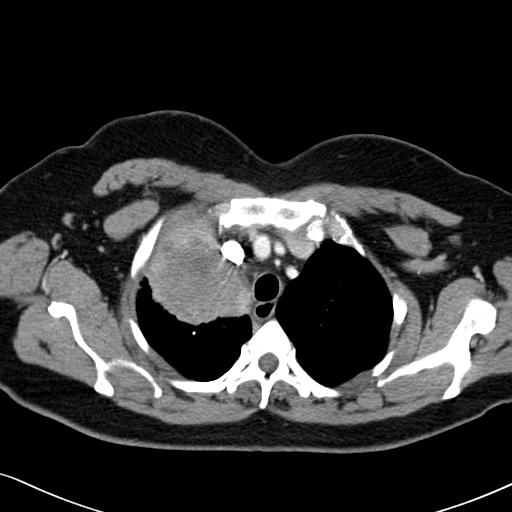
[im 84/105  lung]
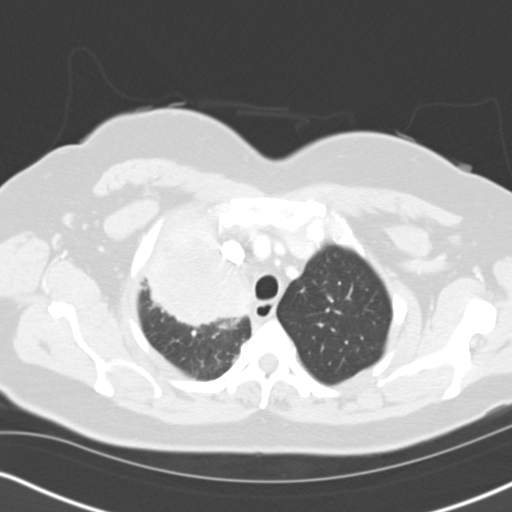
[im 89/105  lung]
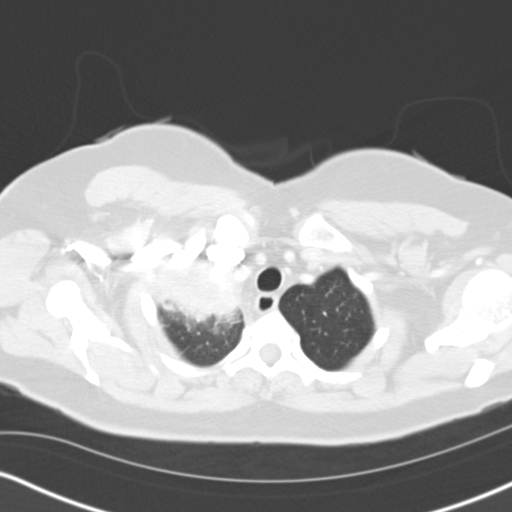
[im 97/105  lung]
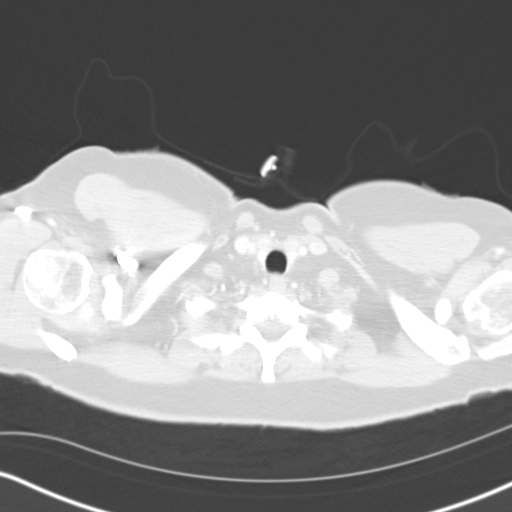

[15 of 34 positions shown; findings below may reference images not displayed]

FINDINGS: There are multiple enlarged right paratracheal and precarinal lymph nodes.
The precarinal lymph node measures approximately 2.5 x 1.6 cm. There are
multiple enlarged right hilar lymph nodes. No left hilar or axillary
lymphadenopathy. Small low-attenuation focus in the right kidney is too
small to characterize. The adrenals are normal in size.

There is a large mass within the right upper lobe. It measures approximately
7.2 x 6.4 cm in greatest axial dimension. The mass extends to the right
hilum and is inseparable from the right side of the mediastinum. There is
slight mass effect on the inferior aspect of the SVC. The mass extends
peripherally to the anterior pleural margin of the right upper thorax. The
mass causes marked narrowing of the right upper lobe pulmonary artery. There
is a 4 mm groundglass nodule in the left lower lobe, image 50. There are a
few 4 mm groundglass nodule in the left lung apex. There is a 3 mm
groundglass nodule in the left upper lobe, image 36.

No aggressive lytic or sclerotic osseous lesions are identified.
IMPRESSION: 1. Large mass in the right upper lobe, concerning for primary pulmonary
malignancy.
2. Enlarged mediastinal and right hilar lymph nodes are concerning for
metastatic disease.
3. Indeterminate subcentimeter nodules in the left lung, which are 4 mm or
less.

The report was called to the ordering clinician immediately after the
dictation.

[REDACTED]

## 2015-03-30 IMAGING — CT CT CHEST W/O CM
1 of 2 series · 14 of 32 positions shown, 18 images · non-contrast
Comparison: none

REASON FOR EXAM: Lung mass restage
COMMENTS:

PROCEDURE:     KCT - KCT CHEST WITHOUT CONTRAST  - November 02, 2012 [DATE]
RESULT:
Comparison is made to a prior study dated 08/24/2012.
TECHNIQUE: Helical noncontrast 5 mm sections were obtained from the thoracic
inlet through the lung bases.

[Series 2: chest w/o 3.0 i31f 2 · axial · non-contrast · 0.63mm/px · z∈[-667,-439]mm · 14 of 90 slices shown, 18 images]
[im 7/90  mediastinal]
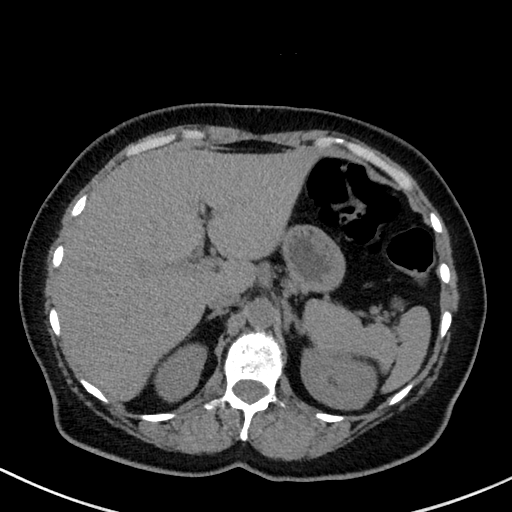
[im 7/90  lung]
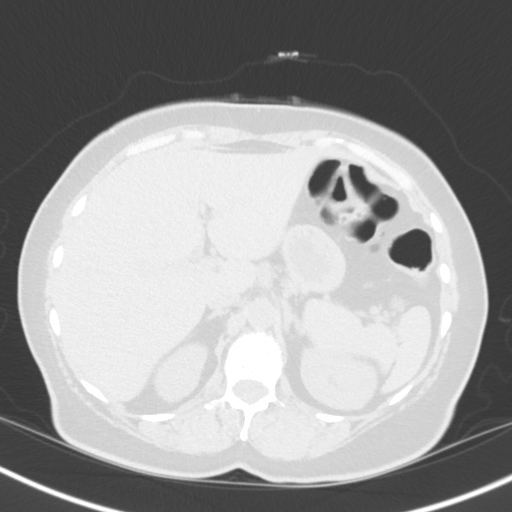
[im 14/90  lung]
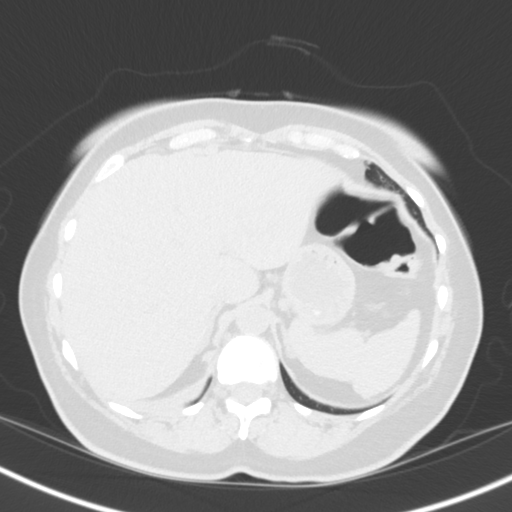
[im 21/90  lung]
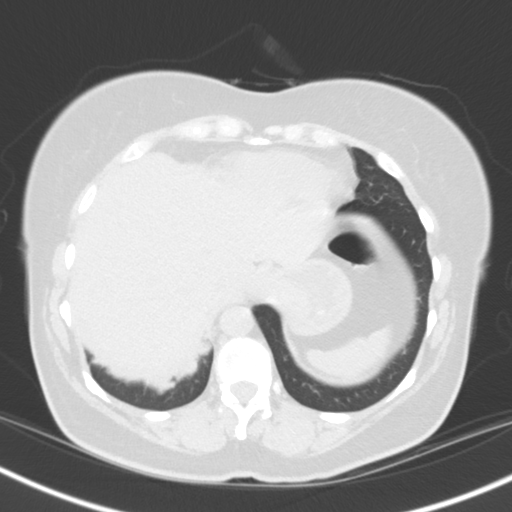
[im 28/90  lung]
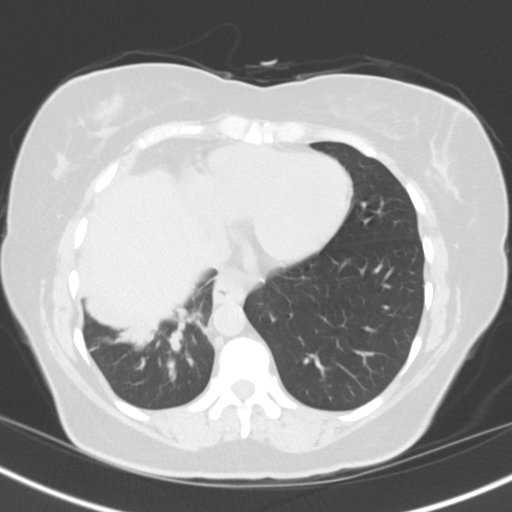
[im 35/90  mediastinal]
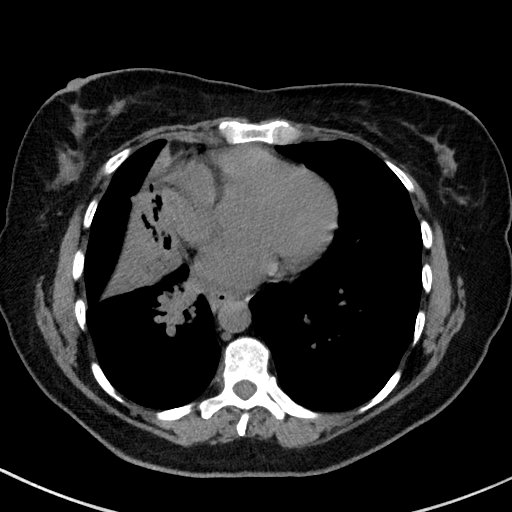
[im 35/90  lung]
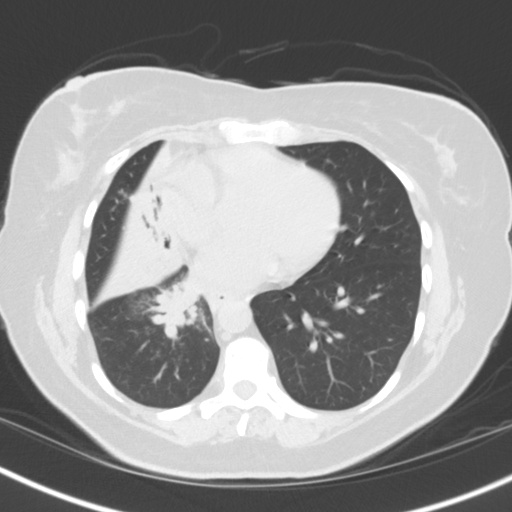
[im 42/90  lung]
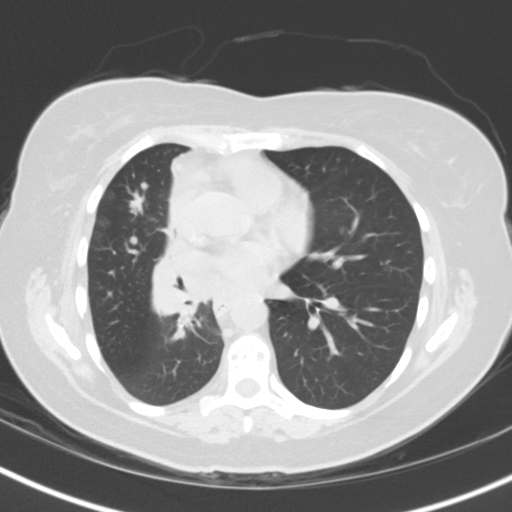
[im 43/90  lung]
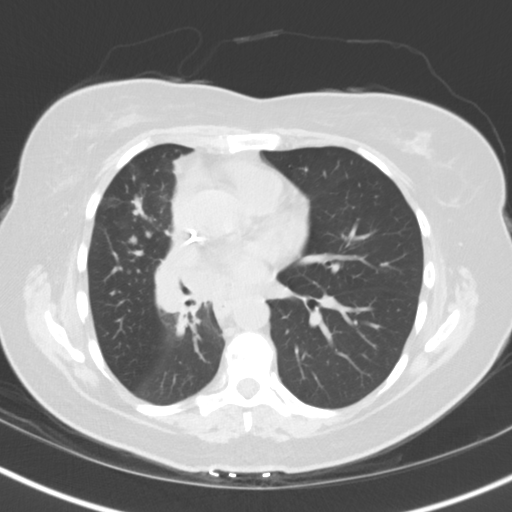
[im 45/90  lung]
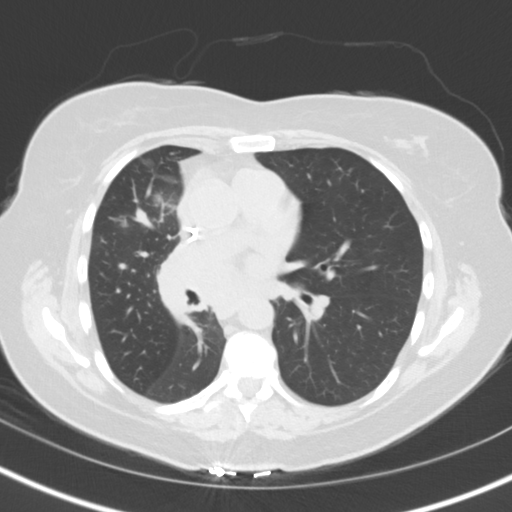
[im 48/90  mediastinal]
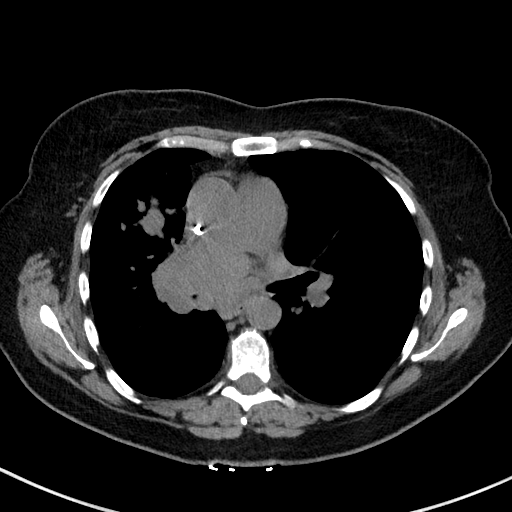
[im 48/90  lung]
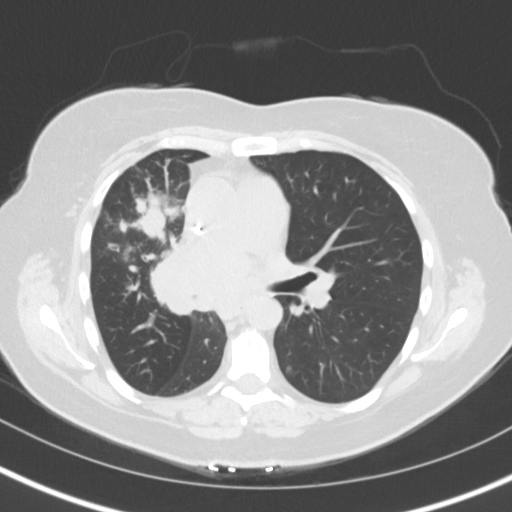
[im 55/90  lung]
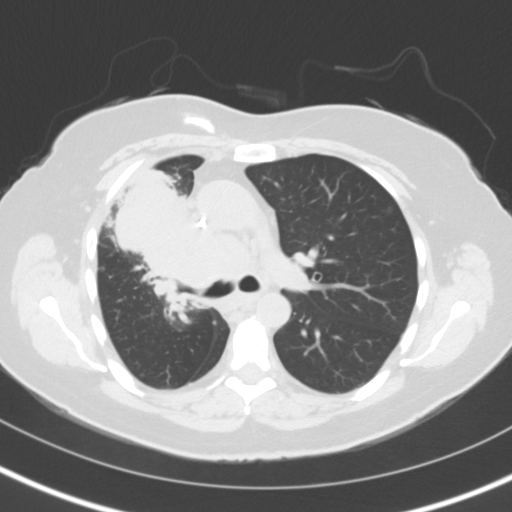
[im 62/90  lung]
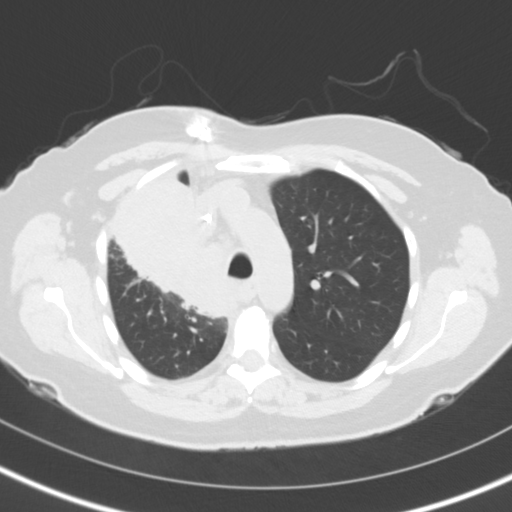
[im 69/90  lung]
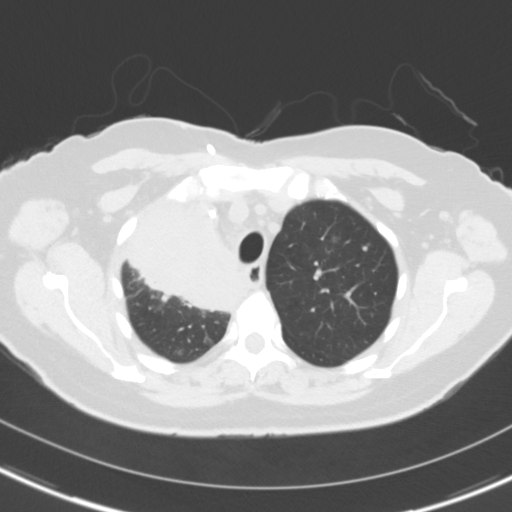
[im 76/90  mediastinal]
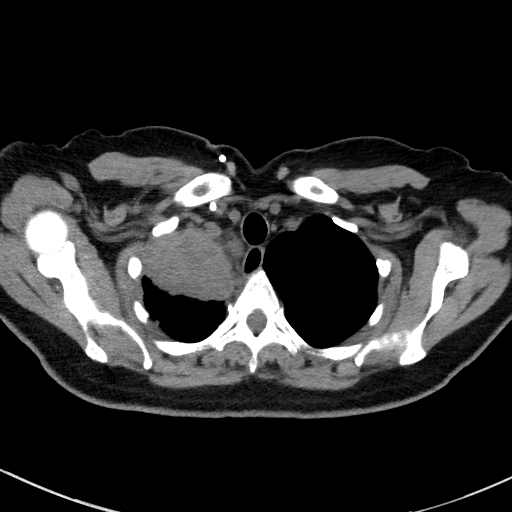
[im 76/90  lung]
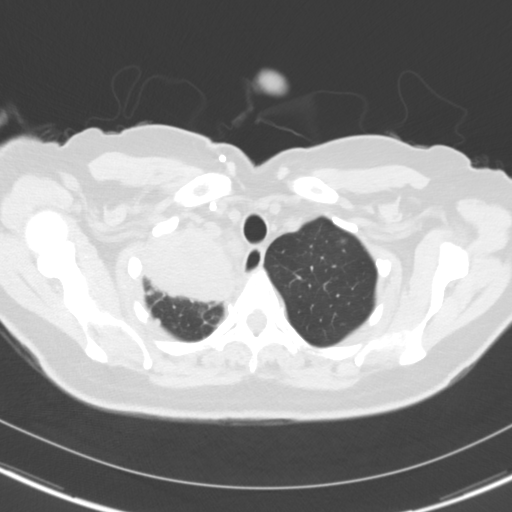
[im 83/90  lung]
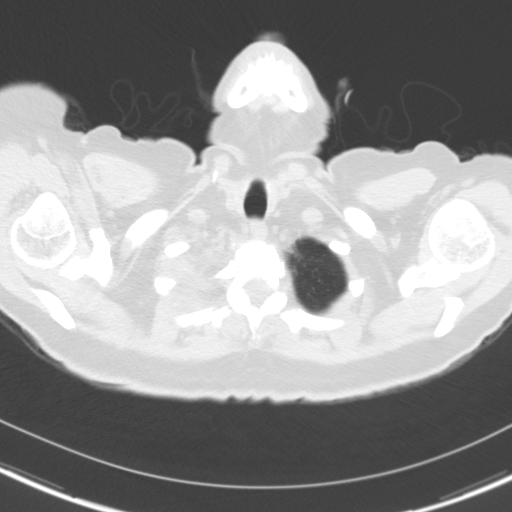

[14 of 32 positions shown; findings below may reference images not displayed]

FINDINGS: A right upper lobe soft tissue mass is appreciated with extension
into the right hilar region. When compared to the previous study, the mass
appears to have increased in size and conspicuity measuring 8.83 x 5.64 cm
in maximal orthogonal dimensions on image #25 of the soft tissue series.
There is compression and/or possible invasion of the right upper lobe
bronchus as well as the right middle lobe bronchus and a component of the
bronchus intermedius. Within the right lung base, there is
atelectatic/consolidative lung within the medial segment of the right middle
lobe. This finding has developed in the interim when compared to the
previous study. Volume loss is identified within the right hemithorax and
also developed in the interim. No further focal regions of consolidation or
focal infiltrates or masses are identified.

The visualized upper abdominal viscera demonstrate no gross abnormalities.
IMPRESSION: Findings consistent with increased disease burden involving
the right upper lobe mass. There are findings consistent with mediastinal
invasion as well as areas of compression and/or invasion of the right upper
lobe bronchus, right middle lobe and bronchus intermedius.
